# Patient Record
Sex: Male | Born: 1980 | Hispanic: No | Marital: Married | Smoking: Never smoker
Health system: Southern US, Community
[De-identification: ages and names within clinical notes are randomized; demographics above are authoritative.]

## PROBLEM LIST (undated history)

## (undated) DIAGNOSIS — I10 Essential (primary) hypertension: Secondary | ICD-10-CM

---

## 2013-06-24 ENCOUNTER — Encounter (HOSPITAL_COMMUNITY): Payer: Self-pay | Admitting: Emergency Medicine

## 2013-06-24 ENCOUNTER — Emergency Department (INDEPENDENT_AMBULATORY_CARE_PROVIDER_SITE_OTHER)
Admission: EM | Admit: 2013-06-24 | Discharge: 2013-06-24 | Disposition: A | Discharge status: Home / Self Care | Payer: Self-pay | Source: Home / Self Care | Attending: Family Medicine | Admitting: Family Medicine

## 2013-06-24 DIAGNOSIS — K047 Periapical abscess without sinus: Secondary | ICD-10-CM

## 2013-06-24 NOTE — ED Notes (Signed)
Right upper tooth pain, draining gums, and facial swelling.

## 2013-06-24 NOTE — Discharge Instructions (Signed)
see your dentist directly

## 2013-06-24 NOTE — ED Provider Notes (Signed)
CSN: [id number]     Arrival date & time 06/24/13  1125 History   First MD Initiated Contact with Patient 06/24/13 1127     Chief Complaint  Patient presents with  . Dental Pain  . Facial Swelling     (Consider location/radiation/quality/duration/timing/severity/associated sxs/prior Treatment) Patient is a 33 y.o. male presenting with tooth pain. The history is provided by the patient and a friend.  Dental Pain Location:  Upper Upper teeth location:  7/RU lateral incisor Quality:  Throbbing and constant Severity:  Moderate Onset quality:  Gradual Duration:  3 days Progression:  Worsening Chronicity:  New Context: abscess and poor dentition   Associated symptoms: facial pain, facial swelling and gum swelling   Risk factors: lack of dental care and periodontal disease     History reviewed. No pertinent past medical history. History reviewed. No pertinent past surgical history. No family history on file. History  Substance Use Topics  . Smoking status: Never Smoker   . Smokeless tobacco: Not on file  . Alcohol Use: Yes    Review of Systems  Constitutional: Negative.   HENT: Positive for dental problem and facial swelling.       Allergies  Review of patient's allergies indicates no known allergies.  Home Medications  No current outpatient prescriptions on file. There were no vitals taken for this visit. Physical Exam  Nursing note and vitals reviewed. Constitutional: He appears distressed.  HENT:  Mouth/Throat: Abnormal dentition. Dental abscesses and dental caries present.      ED Course  Procedures (including critical care time) Labs Review Labs Reviewed  CULTURE, ROUTINE-ABSCESS   Imaging Review No results found.    MDM   Final diagnoses:  Dental abscess        Linna HoffJames D Kindl, MD 06/24/13 [phone]

## 2013-06-28 LAB — CULTURE, ROUTINE-ABSCESS: CULTURE: NORMAL

## 2015-06-05 ENCOUNTER — Emergency Department (INDEPENDENT_AMBULATORY_CARE_PROVIDER_SITE_OTHER): Payer: Self-pay

## 2015-06-05 ENCOUNTER — Emergency Department (INDEPENDENT_AMBULATORY_CARE_PROVIDER_SITE_OTHER)
Admission: EM | Admit: 2015-06-05 | Discharge: 2015-06-05 | Disposition: A | Discharge status: Home / Self Care | Payer: Self-pay | Source: Home / Self Care | Attending: Emergency Medicine | Admitting: Emergency Medicine

## 2015-06-05 ENCOUNTER — Encounter (HOSPITAL_COMMUNITY): Payer: Self-pay

## 2015-06-05 DIAGNOSIS — J40 Bronchitis, not specified as acute or chronic: Secondary | ICD-10-CM

## 2015-06-05 MED ORDER — GUAIFENESIN-CODEINE 100-10 MG/5ML PO SYRP: 5.0000 mL | ORAL_SOLUTION | Freq: Four times a day (QID) | ORAL | Status: AC | PRN

## 2015-06-05 MED ORDER — IPRATROPIUM-ALBUTEROL 0.5-2.5 (3) MG/3ML IN SOLN
3.0000 mL | Freq: Once | RESPIRATORY_TRACT | Status: AC
  Administered 2015-06-05: 3 mL via RESPIRATORY_TRACT

## 2015-06-05 MED ORDER — PREDNISONE 50 MG PO TABS: 50.0000 mg | ORAL_TABLET | Freq: Every day | ORAL | Status: DC

## 2015-06-05 MED ORDER — ALBUTEROL SULFATE HFA 108 (90 BASE) MCG/ACT IN AERS: 1.0000 | INHALATION_SPRAY | Freq: Four times a day (QID) | RESPIRATORY_TRACT | Status: DC | PRN

## 2015-06-05 MED ORDER — AEROCHAMBER PLUS MISC: Status: AC

## 2015-06-05 MED ORDER — IPRATROPIUM-ALBUTEROL 0.5-2.5 (3) MG/3ML IN SOLN
RESPIRATORY_TRACT | Status: AC
  Filled 2015-06-05: qty 3

## 2015-06-05 NOTE — ED Provider Notes (Signed)
HPI  SUBJECTIVE:  John Aguirre is a 35 y.o. male who presents with dry cough, bodyaches, fatigue for 3 days. He reports intermittent, seconds lung headaches with cough. He denies any other headaches. Symptoms are better with rest, worse with coughing. He has been taking over-the-counter cough syrup without improvement. He denies nausea, vomiting, fevers, chest pain, shortness of breath, wheezing. No ear pain, sore throat, no rhinorrhea, nasal congestion, sinus pain/pressure, postnasal drip. No abdominal pain. No rash. States that he is unable to sleep at night secondary to coughing. He reports sick contacts with URI-like symptoms. No antipyretic in the past 4-6 hours. Past medical history negative for asthma, emphysema, COPD, smoking, diabetes, hypertension. PMD: vnone.    History reviewed. No pertinent past medical history.  History reviewed. No pertinent past surgical history.  No family history on file.  Social History  Substance Use Topics  . Smoking status: Never Smoker   . Smokeless tobacco: None  . Alcohol Use: Yes    No current facility-administered medications for this encounter.  Current outpatient prescriptions:  .  albuterol (PROVENTIL HFA;VENTOLIN HFA) 108 (90 Base) MCG/ACT inhaler, Inhale 1-2 puffs into the lungs every 6 (six) hours as needed for wheezing or shortness of breath., Disp: 1 Inhaler, Rfl: 0 .  guaiFENesin-codeine (CHERATUSSIN AC) 100-10 MG/5ML syrup, Take 5 mLs by mouth 4 (four) times daily as needed for cough or congestion., Disp: 120 mL, Rfl: 0 .  predniSONE (DELTASONE) 50 MG tablet, Take 1 tablet (50 mg total) by mouth daily with breakfast., Disp: 5 tablet, Rfl: 0 .  Spacer/Aero-Holding Chambers (AEROCHAMBER PLUS) inhaler, Use as instructed, Disp: 1 each, Rfl: 2  No Known Allergies   ROS  As noted in HPI.   Physical Exam  BP 135/88 mmHg  Pulse 91  Temp(Src) 97.6 F (36.4 C) (Oral)  Resp 20  SpO2 100%  Constitutional: Well developed, well  nourished, no acute distress Eyes:  EOMI, conjunctiva normal bilaterally HENT: Normocephalic, atraumatic,mucus membranes moist TMs normal bilaterally. No nasal congestion. No sinus tenderness. Normal oropharynx. No postnasal drip. Lymph: No cervical lymphadenopathy Respiratory: Normal inspiratory effort. + rhonchi and wheezing left lower lobe. Fair air movement. Cardiovascular: Normal rate regular rhythm, no murmurs, rubs, gallops GI: nondistended skin: No rash, skin intact Musculoskeletal: no deformities Neurologic: Alert & oriented x 3, no focal neuro deficits Psychiatric: Speech and behavior appropriate   ED Course   Medications  ipratropium-albuterol (DUONEB) 0.5-2.5 (3) MG/3ML nebulizer solution 3 mL (3 mLs Nebulization Given 06/05/15 1755)    Orders Placed This Encounter  Procedures  . DG Chest 2 View    Standing Status: Standing     Number of Occurrences: 1     Standing Expiration Date:     Order Specific Question:  Reason for Exam (SYMPTOM  OR DIAGNOSIS REQUIRED)    Answer:  ronchi LLL r/o PNA    No results found for this or any previous visit (from the past 24 hour(s)). Dg Chest 2 View  06/05/2015  CLINICAL DATA:  Patient with cough, nausea for 3 days. EXAM: CHEST  2 VIEW COMPARISON:  None. FINDINGS: Normal cardiac and mediastinal contours. No large area of pulmonary consolidation. No pleural effusion or pneumothorax. Regional skeleton is unremarkable. IMPRESSION: No active cardiopulmonary disease. Electronically Signed   By: Annia Belt M.D.   On: 06/05/2015 18:00    ED Clinical Impression  Bronchitis   ED Assessment/Plan  Reviewed imaging independently. No pneumonia. See radiology report for full details. Reevaluation after DuoNeb,, lungs clear.  Improved air movement. Patient states that he feels better. Presentation consistent with bronchitis. Home with albuterol with spacer, cough syrup, steroids. Will give primary care referral.  Discussed  imaging, MDM, plan  and followup with patient . Discussed sn/sx that should prompt return to the UC or ED. Patient  agrees with plan.   *This clinic note was created using Dragon dictation software. Therefore, there may be occasional mistakes despite careful proofreading.  ?   Domenick Gong, MD 06/05/15 2155

## 2015-06-05 NOTE — Discharge Instructions (Signed)
Go to www.goodrx.com to look up your medications. This will give you a list of where you can find your prescriptions at the most affordable prices.   This practice is taking new patients. They will see you even if you do not have insurance.  [facility] [address] [place], [place]  [address] [phone]  If you have no primary doctor, here are some resources that may be helpful:  Medicaid-accepting [facility] Providers: - [facility]- [address]  [phone];   - [facility]- [address] [place], [address] [phone]  - [facility]- [address] [phone]  [facility]- [address] [phone]  - Renaye Rakers- [address] [phone]. Only accepts [place] patients after they have her name applied to their card  -Dr. Greggory Stallion Osei-Bonsu, [facility]. [address]    [place], [place] [address]  [phone]  Self Pay (no insurance) in [place]: - Sickle Cell Patients: Dr Willey Blade, [facility] [address] [place] [phone]  - Health [phone]  - Physician Referral Service- [phone]  - [facility]- [address], [place], [phone];  Monday to Friday, 9 a.m. - 7 p.m.; Saturday 9 a.m. to 1 p.m.  [facility]- [address] [place], [place] [phone]  - [facility]- [address]      [phone] - [facility]- [address] [phone]  [facility], [address], [place]; [phone]; or [address] [place]; [phone].   [facility] [place], [place]. [address], [place]; [phone]; Monday to Wednesday, 8:30 a.m. - 5 p.m.; Thursday, 8:30 a.m. - 8 p.m.  [facility], [address], [employer];  [phone]; Monday to Friday, 8 a.m. - 4:30 p.m.   [facility], [place] [address] [place], [phone]; first and third Saturday of the month, 9:30 a.m. - 12:30 p.m.  [facility], [address], [place], [phone]; second Saturday of the month, 9 a.m. -noon.  [facility] for children. For information, call [phone]; [phone]; or [phone].  Other agencies that provide inexpensive medical care:     [facility]  [phone]    [facility]  [phone]    [facility]  [phone] [address] [place] [address]    [facility]  [phone]    [facility]  [phone], [phone]; or [phone].  Chronic Pain Problems Contact [facility]  [phone] Patients need to be referred by their primary care doctor.  [facility]  [facility]     [employer]                          [facility] Dept. [address] [place]                       [address]      [address] [place] [address]   [phone] (After Hours)  General Information: Finding a doctor when you do not have health insurance can be tricky. Although you are not limited by an insurance plan, you  are of course limited by her finances and how much but he can pay out of pocket. ° °What are your options if you don't have health insurance? °  °1) Find a Doctor and Pay Out of Pocket °Although you won't have to find out who is covered by your insurance plan, it is a good idea to ask around and get recommendations. You will then need to call the office and see if the doctor you have chosen will accept you as a new patient and what types of options they offer for patients who are self-pay. Some doctors offer discounts or will set up payment plans for their patients who do not have insurance, but you will need to ask so you aren't surprised when you get to your appointment. ° °2) Contact Your  Local Health Department °Not all health departments have doctors that can see patients for sick visits, but many do, so it is worth a call to see if yours does. If you don't know where your local health department is, you can check in your phone book. The CDC also has a tool to help you locate your state's health department, and many state websites also have listings of all of their local health departments. ° °3) Find a Walk-in Clinic °If your illness is not likely to be very severe or complicated, you may want to try a walk in clinic. These are popping up all over the country in pharmacies, drugstores, and shopping centers. They're usually staffed by nurse practitioners or physician assistants that have been trained to treat common illnesses and complaints. They're usually fairly quick and inexpensive. However, if you have serious medical issues or chronic medical problems, these are probably not your best option ° °

## 2015-06-05 NOTE — ED Notes (Signed)
Patient complains of cough headache and some generalized aches that Started three days ago Patient has been using OTC cough syrup with little relief

## 2016-10-10 IMAGING — DX DG CHEST 2V
2 series · 2 of 2 positions shown · non-contrast
Comparison: None.

CLINICAL DATA: Patient with cough, nausea for 3 days.

EXAM:
CHEST  2 VIEW

[chest pa]
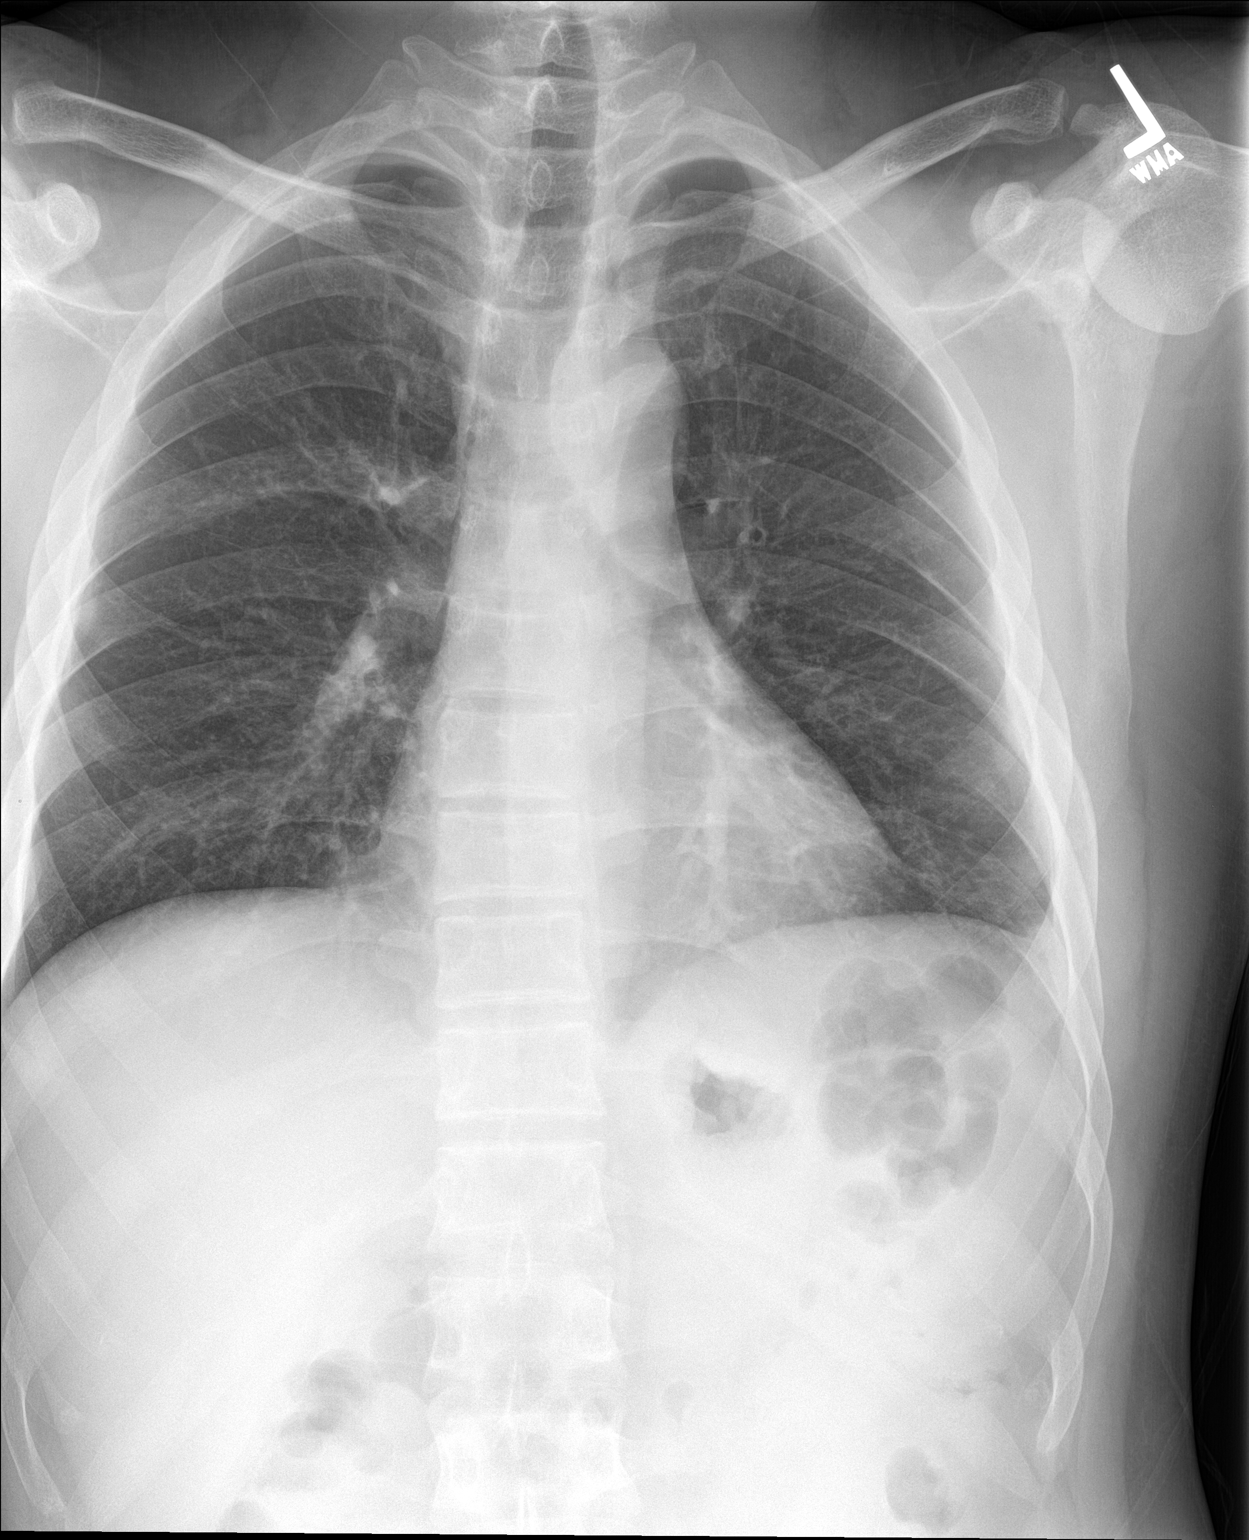

[chest lat]
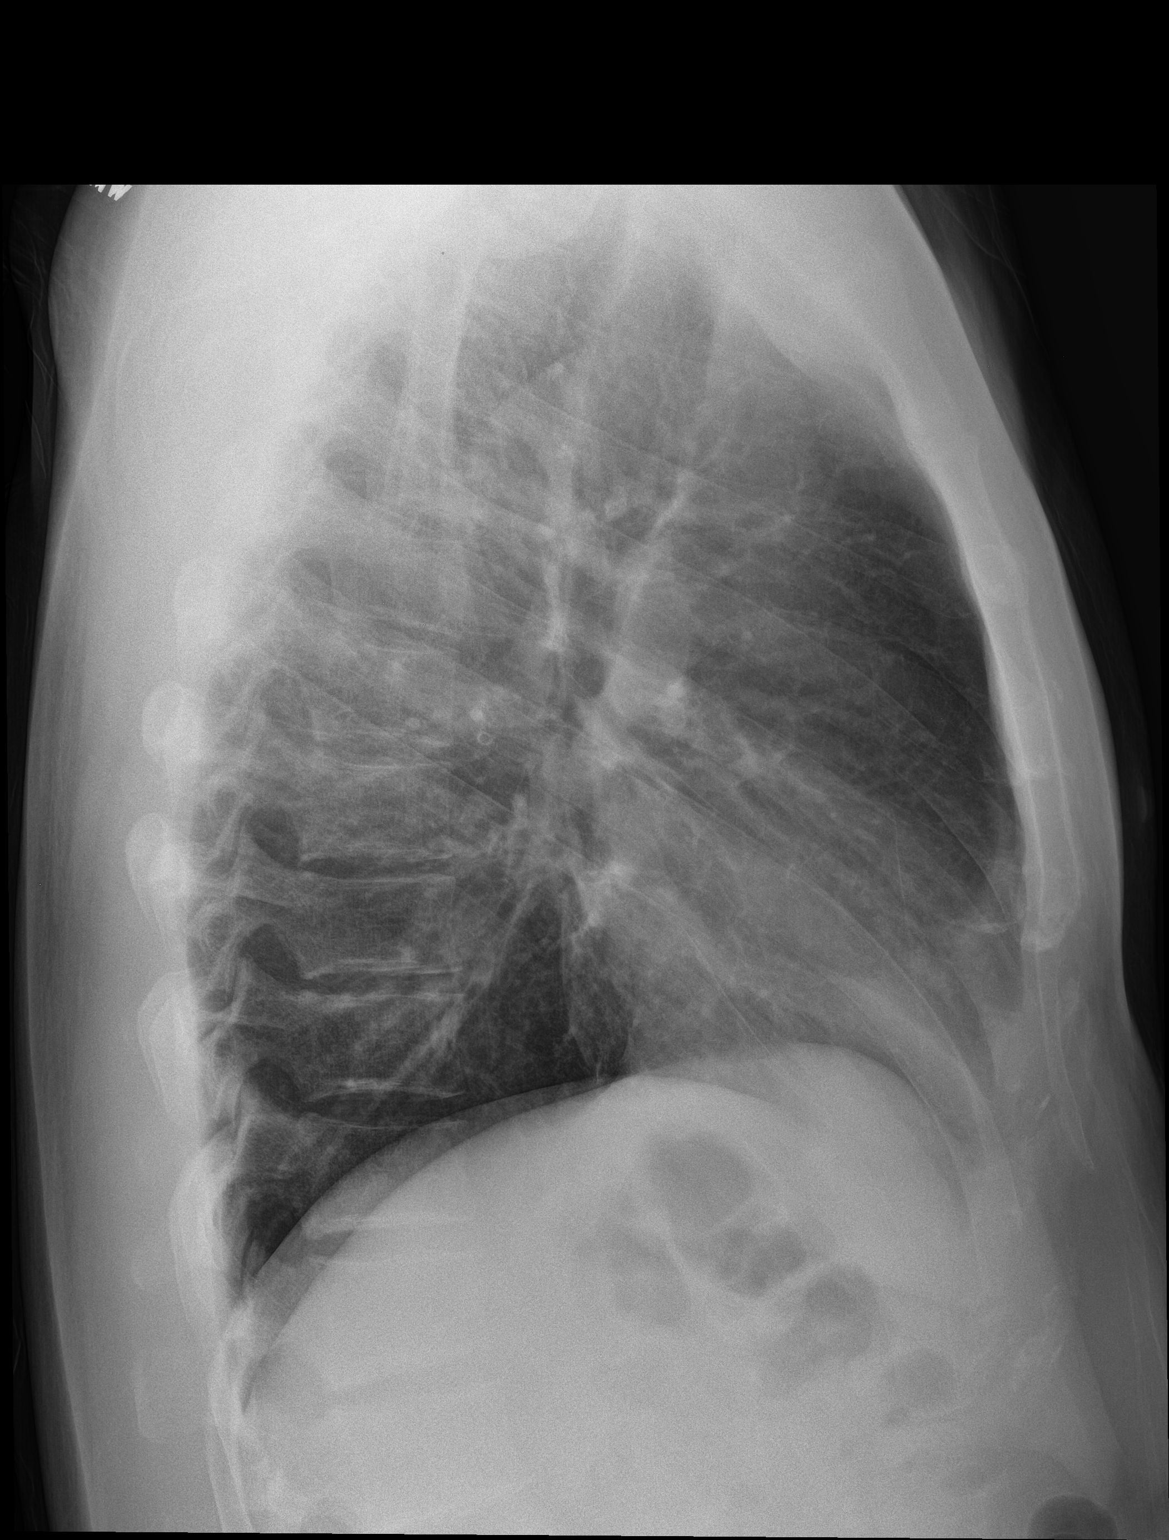

[2 of 2 positions shown; findings below may reference images not displayed]

FINDINGS: Normal cardiac and mediastinal contours. No large area of pulmonary
consolidation. No pleural effusion or pneumothorax. Regional
skeleton is unremarkable.
IMPRESSION: No active cardiopulmonary disease.

## 2017-07-17 ENCOUNTER — Encounter (HOSPITAL_COMMUNITY): Payer: Self-pay | Admitting: Emergency Medicine

## 2017-07-17 ENCOUNTER — Ambulatory Visit (HOSPITAL_COMMUNITY)
Admission: EM | Admit: 2017-07-17 | Discharge: 2017-07-17 | Disposition: A | Discharge status: Home / Self Care | Payer: Managed Care, Other (non HMO) | Attending: Family Medicine | Admitting: Family Medicine

## 2017-07-17 DIAGNOSIS — L03211 Cellulitis of face: Secondary | ICD-10-CM

## 2017-07-17 MED ORDER — SULFAMETHOXAZOLE-TRIMETHOPRIM 800-160 MG PO TABS: 1.0000 | ORAL_TABLET | Freq: Two times a day (BID) | ORAL | 0 refills | Status: AC

## 2017-07-17 NOTE — ED Triage Notes (Signed)
PT scratched the inside of his nose with his finger Saturday. PT's face is now red and swollen.

## 2017-07-19 ENCOUNTER — Ambulatory Visit (HOSPITAL_COMMUNITY)
Admission: EM | Admit: 2017-07-19 | Discharge: 2017-07-19 | Disposition: A | Discharge status: Home / Self Care | Payer: Managed Care, Other (non HMO) | Attending: Internal Medicine | Admitting: Internal Medicine

## 2017-07-19 ENCOUNTER — Encounter (HOSPITAL_COMMUNITY): Payer: Self-pay | Admitting: Family Medicine

## 2017-07-19 DIAGNOSIS — L0201 Cutaneous abscess of face: Secondary | ICD-10-CM

## 2017-07-19 DIAGNOSIS — J340 Abscess, furuncle and carbuncle of nose: Secondary | ICD-10-CM

## 2017-07-19 MED ORDER — CEFTRIAXONE SODIUM 1 G IJ SOLR
1.0000 g | Freq: Once | INTRAMUSCULAR | Status: AC
  Administered 2017-07-19: 1 g via INTRAMUSCULAR

## 2017-07-19 MED ORDER — LIDOCAINE HCL (PF) 1 % IJ SOLN
INTRAMUSCULAR | Status: AC
  Filled 2017-07-19: qty 2

## 2017-07-19 MED ORDER — CEFTRIAXONE SODIUM 1 G IJ SOLR
INTRAMUSCULAR | Status: AC
  Filled 2017-07-19: qty 10

## 2017-07-19 MED ORDER — AMOXICILLIN-POT CLAVULANATE 875-125 MG PO TABS: 1.0000 | ORAL_TABLET | Freq: Two times a day (BID) | ORAL | 0 refills | Status: AC

## 2017-07-19 MED ORDER — HYDROCODONE-ACETAMINOPHEN 5-325 MG PO TABS: 1.0000 | ORAL_TABLET | Freq: Four times a day (QID) | ORAL | 0 refills | Status: DC | PRN

## 2017-07-19 NOTE — Discharge Instructions (Addendum)
Please [facility]up [facility], [address], [place] at 1:10 pm tomorrow Friday 3/15 for management of boil in nose.  Prescriptions for additional antibiotic (amoxicillin/clavulanate) and for a small number of vicodin (for pain) were sent to the pharmacy.  Continue trimethoprim/sulfa prescription from previous urgent care visit.

## 2017-07-19 NOTE — ED Triage Notes (Signed)
Pt here for continued swelling to left nare and face. He was seen here last week and taking abx and not better. The wound started from a scratch scratching nose. The infection is unchanged.

## 2017-07-19 NOTE — ED Provider Notes (Signed)
[facility]    CSN: [id number] Arrival date & time: 07/19/17  1358     History   Chief Complaint Chief Complaint  Patient presents with  . Wound Infection    HPI John Aguirre is a 37 y.o. male.   He presents today for recheck of 1.5 inch boil in the floor of the left nose, was seen on 3/12 and started on Bactrim for this.  This lesion is draining a small amount of purulent material from 2 sites on the floor of the left nare, but has not changed in size.  No fever, no malaise, just not improving.  Tender to touch.    HPI  History reviewed. No pertinent past medical history.  History reviewed. No pertinent surgical history.     Home Medications    Prior to Admission medications   Medication Sig Start Date End Date Taking? Authorizing Provider  albuterol (PROVENTIL HFA;VENTOLIN HFA) 108 (90 Base) MCG/ACT inhaler Inhale 1-2 puffs into the lungs every 6 (six) hours as needed for wheezing or shortness of breath. 06/05/15   Domenick Gong, MD  amoxicillin-clavulanate (AUGMENTIN) 875-125 MG tablet Take 1 tablet by mouth 2 (two) times daily for 10 days. 07/19/17 07/29/17  Isa Rankin, MD  guaiFENesin-codeine (CHERATUSSIN AC) 100-10 MG/5ML syrup Take 5 mLs by mouth 4 (four) times daily as needed for cough or congestion. 06/05/15   Domenick Gong, MD  HYDROcodone-acetaminophen (NORCO/VICODIN) 5-325 MG tablet Take 1 tablet by mouth 4 (four) times daily as needed. 07/19/17   Isa Rankin, MD  predniSONE (DELTASONE) 50 MG tablet Take 1 tablet (50 mg total) by mouth daily with breakfast. 06/05/15   Domenick Gong, MD  Spacer/Aero-Holding Chambers (AEROCHAMBER PLUS) inhaler Use as instructed 06/05/15   Domenick Gong, MD  sulfamethoxazole-trimethoprim (BACTRIM DS,SEPTRA DS) 800-160 MG tablet Take 1 tablet by mouth 2 (two) times daily for 10 days. 07/17/17 07/27/17  Mardella Layman, MD    Family History History reviewed. No pertinent family history.  Social  History Social History   Tobacco Use  . Smoking status: Never Smoker  Substance Use Topics  . Alcohol use: Yes  . Drug use: Yes     Allergies   Patient has no known allergies.   Review of Systems Review of Systems  All other systems reviewed and are negative.    Physical Exam Triage Vital Signs ED Triage Vitals [07/19/17 1507]  Enc Vitals Group     BP 132/85     Pulse Rate 88     Resp 18     Temp 98.7 F (37.1 C)     Temp src      SpO2 98 %     Weight      Height      Pain Score 7     Pain Loc    Updated Vital Signs BP 132/85   Pulse 88   Temp 98.7 F (37.1 C)   Resp 18   SpO2 98%  Physical Exam  Constitutional: He is oriented to person, place, and time. No distress.  Alert, nicely groomed  HENT:  Head: Atraumatic.  The floor of the left nare is distorted by a 1.5 inch firm slightly soft fluctuant swelling, with surrounding erythema and swelling.  Purulent drainage from 2 small areas in the floor of the left nare.  Some erythema extending onto the left cheek.  Upper lip is swollen.  Dentition is poor with multiple broken/carious teeth.  Eyes:  Conjugate gaze, no eye redness/drainage  Neck: Neck supple.  Cardiovascular: Normal rate.  Pulmonary/Chest: No respiratory distress.  Abdominal: He exhibits no distension.  Musculoskeletal: Normal range of motion.  Neurological: He is alert and oriented to person, place, and time.  Skin: Skin is warm and dry.  No cyanosis  Nursing note and vitals reviewed.    UC Treatments / Results   Procedures Procedures (including critical care time) None today  Medications Ordered in UC Medications  cefTRIAXone (ROCEPHIN) injection 1 g (1 g Intramuscular Given 07/19/17 1559)    Final Clinical Impressions(s) / UC Diagnoses   Final diagnoses:  Facial abscess   [facility], [address], [place] at 1:10 pm tomorrow Friday 3/15 for management of boil in nose.  Prescriptions for  additional antibiotic (amoxicillin/clavulanate) and for a small number of vicodin (for pain) were sent to the pharmacy.  Continue trimethoprim/sulfa prescription from previous urgent care visit.  ED Discharge Orders        Ordered    amoxicillin-clavulanate (AUGMENTIN) 875-125 MG tablet  2 times daily     07/19/17 1558    HYDROcodone-acetaminophen (NORCO/VICODIN) 5-325 MG tablet  4 times daily PRN     07/19/17 1558         Isa RankinMurray, Cerise Lieber Wilson, MD 07/21/17 1024

## 2017-07-26 NOTE — ED Provider Notes (Signed)
  [facility]   [id number] 07/17/17 Arrival Time: 1827  ASSESSMENT & PLAN:  1. Facial cellulitis     Meds ordered this encounter  Medications  . sulfamethoxazole-trimethoprim (BACTRIM DS,SEPTRA DS) 800-160 MG tablet    Sig: Take 1 tablet by mouth 2 (two) times daily for 10 days.    Dispense:  20 tablet    Refill:  0    Will f/u in 24-48 hours if not seeing improvement.  Will follow up with PCP or here if worsening or failing to improve as anticipated. Reviewed expectations re: course of current medical issues. Questions answered. Outlined signs and symptoms indicating need for more acute intervention. Patient verbalized understanding. After Visit Summary given.   SUBJECTIVE:  John Aguirre is a 37 y.o. male who presents with a skin complaint.   Location: L nare Onset: abrupt Duration: 2 days Pruritic? No Painful? Yes Progression: increasing steadily  Drainage? No  Known trigger? Questions scratching inside of nose.  Other associated symptoms: none Therapies tried thus far: none Denies fever. No specific aggravating or alleviating factors reported.  ROS: As per HPI.  OBJECTIVE: Vitals:   07/17/17 1940 07/17/17 1942  BP:  (!) 146/80  Pulse:  97  Resp:  16  Temp:  99.2 F (37.3 C)  TempSrc:  Oral  SpO2:  98%  Weight: 200 lb (90.7 kg)     General appearance: alert; no distress Nose: inner medial L nare with slight swelling and erythema; no fluctuance; tender Lungs: clear to auscultation bilaterally Heart: regular rate and rhythm Extremities: no edema Skin: warm and dry Psychological: alert and cooperative; normal mood and affect  No Known Allergies   Social History   Socioeconomic History  . Marital status: Married    Spouse name: Not on file  . Number of children: Not on file  . Years of education: Not on file  . Highest education level: Not on file  Occupational History  . Not on file  Social Needs  . Financial resource strain:  Not on file  . Food insecurity:    Worry: Not on file    Inability: Not on file  . Transportation needs:    Medical: Not on file    Non-medical: Not on file  Tobacco Use  . Smoking status: Never Smoker  Substance and Sexual Activity  . Alcohol use: Yes  . Drug use: Yes  . Sexual activity: Not on file  Lifestyle  . Physical activity:    Days per week: Not on file    Minutes per session: Not on file  . Stress: Not on file  Relationships  . Social connections:    Talks on phone: Not on file    Gets together: Not on file    Attends religious service: Not on file    Active member of club or organization: Not on file    Attends meetings of clubs or organizations: Not on file    Relationship status: Not on file  . Intimate partner violence:    Fear of current or ex partner: Not on file    Emotionally abused: Not on file    Physically abused: Not on file    Forced sexual activity: Not on file  Other Topics Concern  . Not on file  Social History Narrative  . Not on file   No family history on file. History reviewed. No pertinent surgical history.   Mardella LaymanHagler, Joyelle Siedlecki, MD 07/26/17 [phone]

## 2018-06-10 ENCOUNTER — Ambulatory Visit (HOSPITAL_COMMUNITY)
Admission: EM | Admit: 2018-06-10 | Discharge: 2018-06-10 | Disposition: A | Discharge status: Home / Self Care | Payer: Managed Care, Other (non HMO) | Attending: Urgent Care | Admitting: Urgent Care

## 2018-06-10 ENCOUNTER — Encounter (HOSPITAL_COMMUNITY): Payer: Self-pay

## 2018-06-10 ENCOUNTER — Ambulatory Visit (INDEPENDENT_AMBULATORY_CARE_PROVIDER_SITE_OTHER): Payer: Managed Care, Other (non HMO)

## 2018-06-10 DIAGNOSIS — R1013 Epigastric pain: Secondary | ICD-10-CM

## 2018-06-10 DIAGNOSIS — R1110 Vomiting, unspecified: Secondary | ICD-10-CM

## 2018-06-10 DIAGNOSIS — J069 Acute upper respiratory infection, unspecified: Secondary | ICD-10-CM

## 2018-06-10 DIAGNOSIS — B9789 Other viral agents as the cause of diseases classified elsewhere: Secondary | ICD-10-CM | POA: Diagnosis not present

## 2018-06-10 MED ORDER — ONDANSETRON 8 MG PO TBDP: 8.0000 mg | ORAL_TABLET | Freq: Three times a day (TID) | ORAL | 0 refills | Status: DC | PRN

## 2018-06-10 MED ORDER — BENZONATATE 100 MG PO CAPS: 100.0000 mg | ORAL_CAPSULE | Freq: Three times a day (TID) | ORAL | 0 refills | Status: DC | PRN

## 2018-06-10 NOTE — ED Triage Notes (Signed)
Pt presents with complaints of cough, emesis, headache and fever x 4 days.

## 2018-06-10 NOTE — ED Provider Notes (Signed)
MRN: [id number] DOB: 06-Jun-1980  Subjective:   John Aguirre is a 38 y.o. male presenting for 4 day history of moderate productive cough that is improving but has caused stomach pain and vomiting. Has tried APAP. Denies taking chronic medications. Denies smoking cigarettes.  Denies history of asthma, respiratory disorders.  No Known Allergies  History reviewed. No pertinent past medical history.   History reviewed. No pertinent surgical history.  Review of Systems  Constitutional: Positive for fever.  HENT: Negative for ear pain, sinus pain and sore throat.   Eyes: Negative for blurred vision and double vision.  Respiratory: Positive for cough.   Cardiovascular: Negative for chest pain.  Gastrointestinal: Positive for abdominal pain and vomiting. Negative for blood in stool, constipation, diarrhea and nausea.  Genitourinary: Negative for dysuria and hematuria.  Musculoskeletal: Positive for myalgias.  Skin: Negative for rash.  Neurological: Positive for headaches.  Psychiatric/Behavioral: Negative for depression.    Objective:   Vitals: BP 113/81   Pulse 94   Temp 98.7 F (37.1 C)   Resp 20   SpO2 100%   Physical Exam Constitutional:      General: He is not in acute distress.    Appearance: Normal appearance. He is well-developed and normal weight. He is not ill-appearing, toxic-appearing or diaphoretic.  HENT:     Head: Normocephalic and atraumatic.     Right Ear: Tympanic membrane, ear canal and external ear normal. There is no impacted cerumen.     Left Ear: Tympanic membrane, ear canal and external ear normal. There is no impacted cerumen.     Nose: Nose normal. No congestion or rhinorrhea.     Mouth/Throat:     Mouth: Mucous membranes are moist.     Pharynx: Oropharynx is clear. No oropharyngeal exudate or posterior oropharyngeal erythema.  Eyes:     General: No scleral icterus.       Right eye: No discharge.        Left eye: No discharge.     Extraocular  Movements: Extraocular movements intact.     Conjunctiva/sclera: Conjunctivae normal.     Pupils: Pupils are equal, round, and reactive to light.  Neck:     Musculoskeletal: Normal range of motion and neck supple. No neck rigidity or muscular tenderness.  Cardiovascular:     Rate and Rhythm: Normal rate and regular rhythm.     Heart sounds: Normal heart sounds. No murmur. No friction rub. No gallop.   Pulmonary:     Effort: Pulmonary effort is normal. No respiratory distress.     Breath sounds: No stridor. Rhonchi (left sided mid-lower lung fields) present. No wheezing or rales.  Abdominal:     General: Bowel sounds are normal. There is no distension.     Palpations: Abdomen is soft.     Tenderness: There is abdominal tenderness (epigastric). There is no right CVA tenderness, left CVA tenderness, guarding or rebound.  Neurological:     General: No focal deficit present.     Mental Status: He is alert and oriented to person, place, and time.  Psychiatric:        Mood and Affect: Mood normal.        Behavior: Behavior normal.        Thought Content: Thought content normal.    Dg Chest 2 View  Result Date: 06/10/2018 CLINICAL DATA:  38 year old male with productive cough, post-tussive emesis. Abnormal left lung auscultation. EXAM: CHEST - 2 VIEW COMPARISON:  Chest radiographs 06/05/2015. FINDINGS: Normal mediastinal  contours. Mildly lower lung volumes today. Visualized tracheal air column is within normal limits. The upper lungs appear stable in clear. No pneumothorax, pulmonary edema or pleural effusion. Mild crowding of lower lung markings with no convincing acute or confluent pulmonary opacity. No acute osseous abnormality identified. Negative visible bowel gas pattern. IMPRESSION: Lower lung volumes, with no convincing acute cardiopulmonary abnormality. Electronically Signed   By: Odessa FlemingH  Hall M.D.   On: 06/10/2018 18:34   Assessment and Plan :   Viral URI with cough  Abdominal pain,  epigastric  Post-tussive emesis  Chest x-ray is negative.  Will manage for viral type illness including influenza versus standard viral URI.  Recommended supportive care, use Tessalon and Zofran for his symptomatic relief. Counseled patient on potential for adverse effects with medications prescribed today, patient verbalized understanding. ER and return-to-clinic precautions discussed, patient verbalized understanding.    Wallis BambergMani, Aashrith Eves, [place] 06/10/18 [id number]

## 2018-06-10 NOTE — Discharge Instructions (Signed)
We will manage this as a viral syndrome. For sore throat or cough try using a honey-based tea. Use 3 teaspoons of honey with juice squeezed from half lemon. Place shaved pieces of ginger into 1/2-1 cup of water and warm over stove top. Then mix the ingredients and repeat every 4 hours as needed. Please take ibuprofen 400mg every 6 hours alternating with OR taken together with Tylenol 500mg every 6 hours. Hydrate very well with at least 2 liters of water. Eat light meals such as soups to replenish electrolytes and soft fruits, veggies. Start an antihistamine like Zyrtec, Allegra or Claritin.  °

## 2019-06-04 ENCOUNTER — Ambulatory Visit: Payer: Managed Care, Other (non HMO) | Attending: Internal Medicine

## 2019-06-04 DIAGNOSIS — Z20822 Contact with and (suspected) exposure to covid-19: Secondary | ICD-10-CM

## 2019-06-05 LAB — NOVEL CORONAVIRUS, NAA: SARS-CoV-2, NAA: NOT DETECTED

## 2019-06-06 ENCOUNTER — Telehealth: Payer: Self-pay | Admitting: *Deleted

## 2019-06-06 NOTE — Telephone Encounter (Signed)
Patient called given negative covid results . 

## 2019-10-16 IMAGING — DX DG CHEST 2V
2 series · 2 of 2 positions shown · non-contrast
Comparison: Chest radiographs 06/05/2015.

CLINICAL DATA: 37-year-old male with productive cough, post-tussive
emesis. Abnormal left lung auscultation.

EXAM:
CHEST - 2 VIEW

[chest pa]
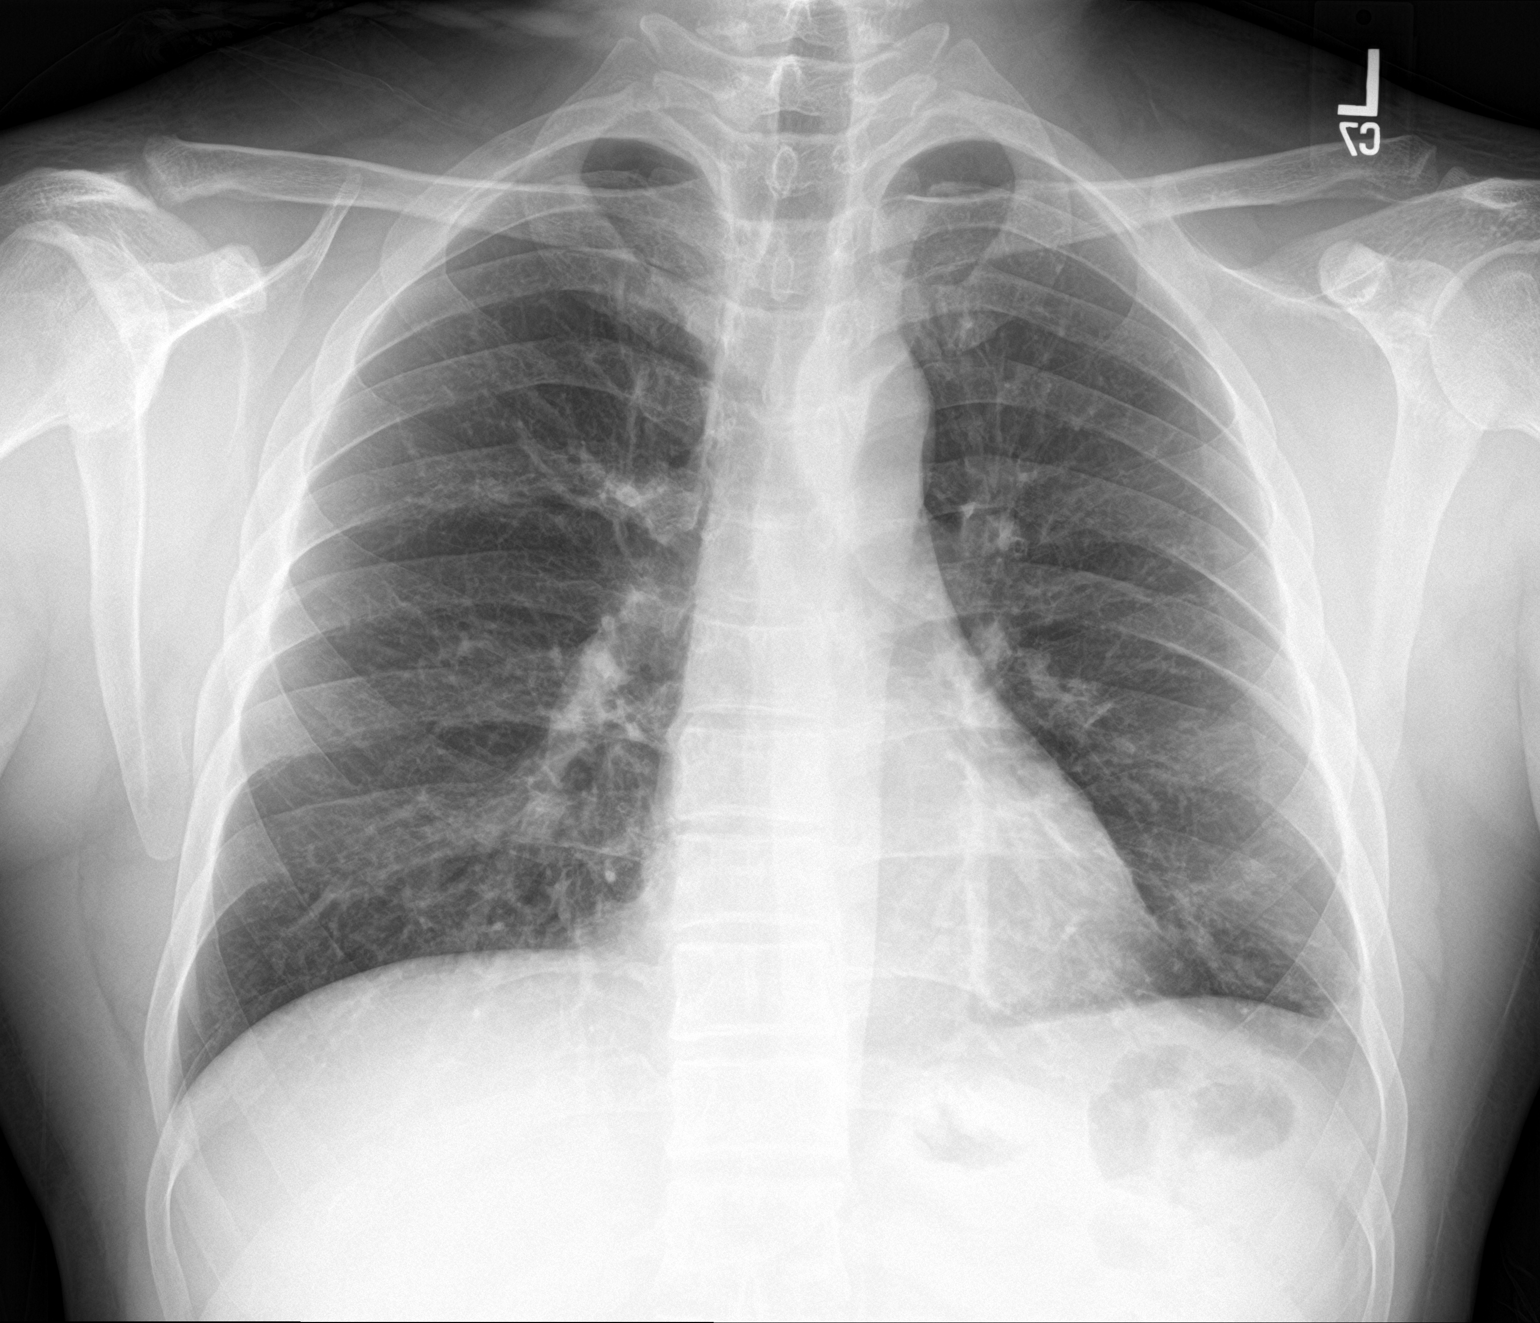

[chest lat]
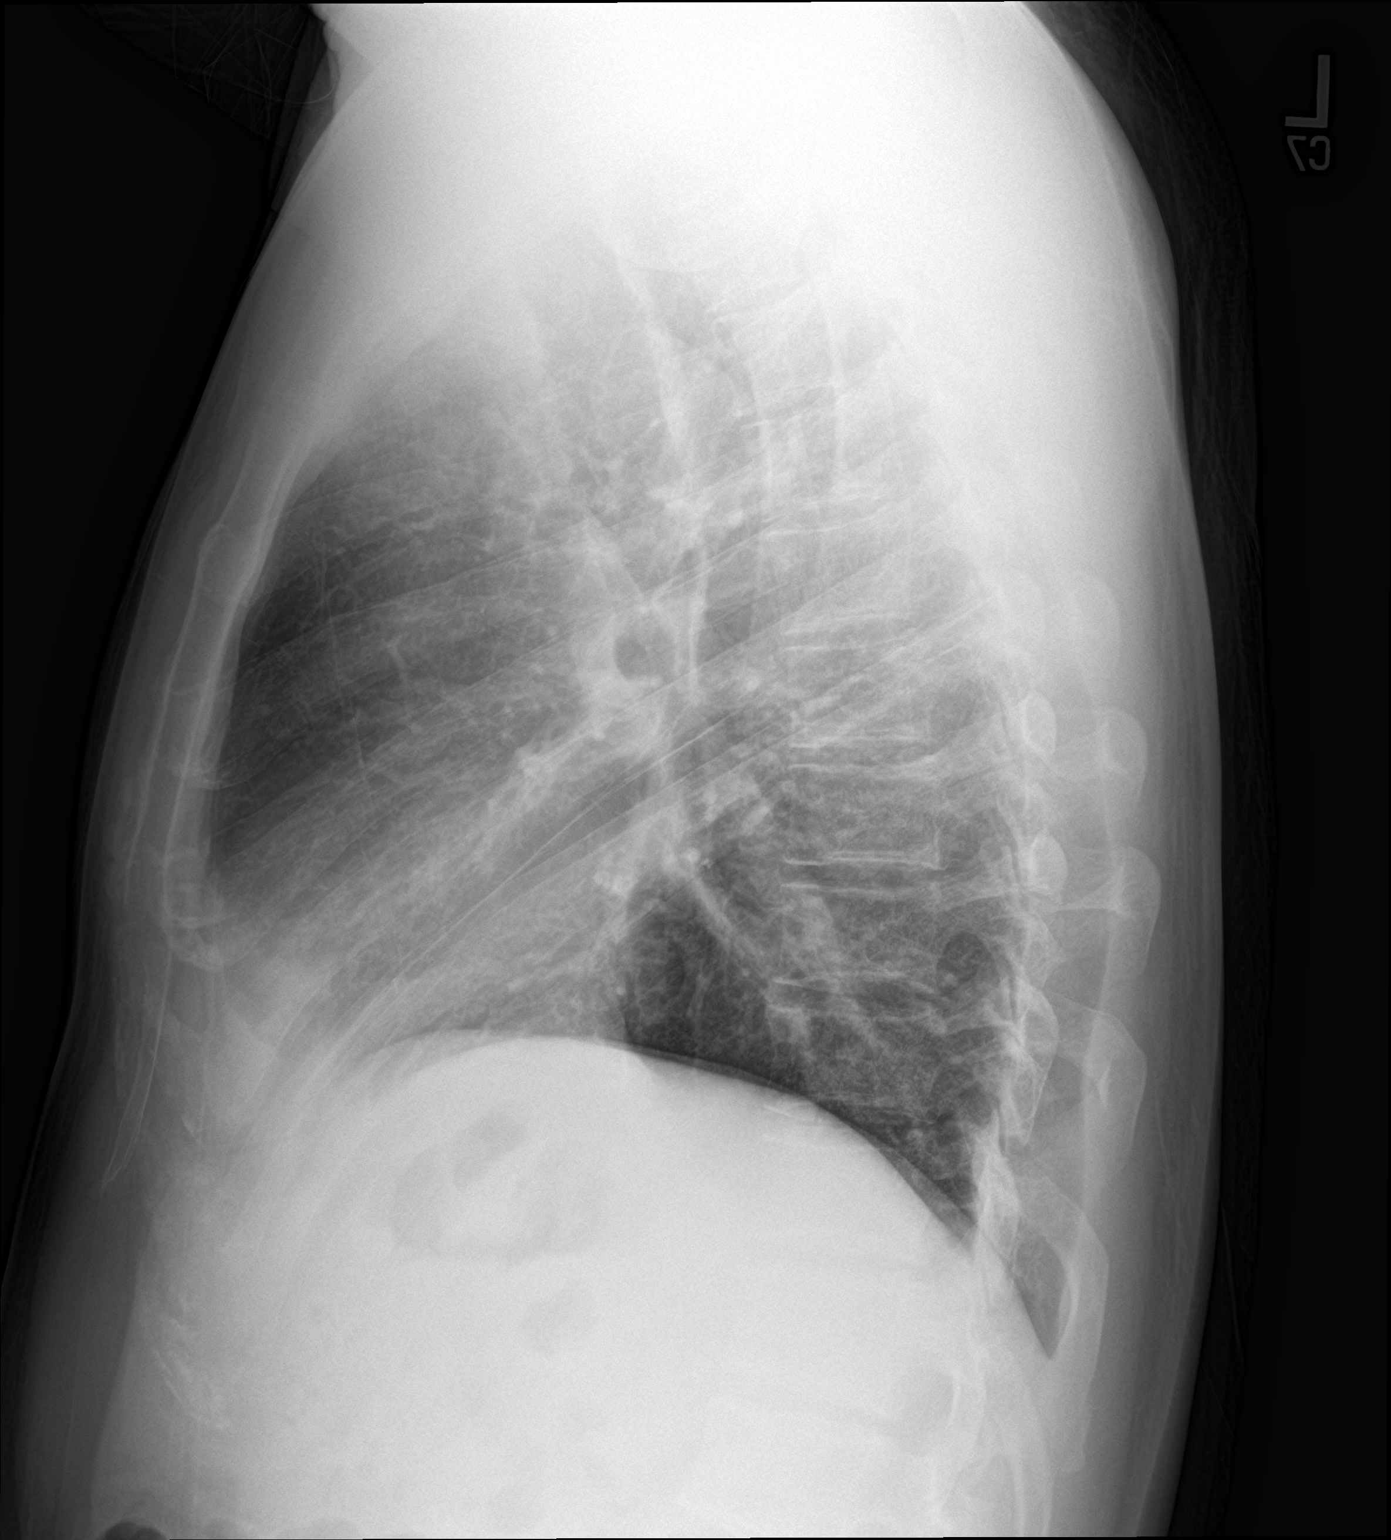

[2 of 2 positions shown; findings below may reference images not displayed]

FINDINGS: Normal mediastinal contours. Mildly lower lung volumes today.
Visualized tracheal air column is within normal limits. The upper
lungs appear stable in clear. No pneumothorax, pulmonary edema or
pleural effusion. Mild crowding of lower lung markings with no
convincing acute or confluent pulmonary opacity. No acute osseous
abnormality identified. Negative visible bowel gas pattern.
IMPRESSION: Lower lung volumes, with no convincing acute cardiopulmonary
abnormality.

## 2020-07-19 ENCOUNTER — Ambulatory Visit (HOSPITAL_COMMUNITY)
Admission: EM | Admit: 2020-07-19 | Discharge: 2020-07-19 | Disposition: A | Discharge status: Home / Self Care | Payer: Managed Care, Other (non HMO)

## 2020-07-19 ENCOUNTER — Encounter (HOSPITAL_COMMUNITY): Payer: Self-pay

## 2020-07-19 ENCOUNTER — Other Ambulatory Visit: Payer: Self-pay

## 2020-07-19 DIAGNOSIS — J3089 Other allergic rhinitis: Secondary | ICD-10-CM | POA: Diagnosis not present

## 2020-07-19 DIAGNOSIS — R059 Cough, unspecified: Secondary | ICD-10-CM

## 2020-07-19 DIAGNOSIS — R062 Wheezing: Secondary | ICD-10-CM

## 2020-07-19 HISTORY — DX: Essential (primary) hypertension: I10

## 2020-07-19 MED ORDER — PREDNISONE 50 MG PO TABS: 50.0000 mg | ORAL_TABLET | Freq: Every day | ORAL | 0 refills | Status: DC

## 2020-07-19 MED ORDER — CETIRIZINE HCL 10 MG PO TABS: 10.0000 mg | ORAL_TABLET | Freq: Every day | ORAL | 2 refills | Status: AC

## 2020-07-19 MED ORDER — ALBUTEROL SULFATE HFA 108 (90 BASE) MCG/ACT IN AERS: 1.0000 | INHALATION_SPRAY | Freq: Four times a day (QID) | RESPIRATORY_TRACT | 0 refills | Status: DC | PRN

## 2020-07-19 MED ORDER — AZELASTINE HCL 0.05 % OP SOLN: 1.0000 [drp] | Freq: Two times a day (BID) | OPHTHALMIC | 12 refills | Status: DC

## 2020-07-19 NOTE — ED Provider Notes (Signed)
[facility]    CSN: [id number] Arrival date & time: 07/19/20  1047      History   Chief Complaint Chief Complaint  Patient presents with  . Cough  . Itchy eyes    HPI John Aguirre is a 40 y.o. male.   Patient presenting today with 60-month history of red, swollen, itchy eyes that drained clear fluid.  This is a chronic issue that flares seasonally.  Has been using lubricating eyedrops with minimal relief.  Denies injury to the eye, blurry vision, headache, fever, temple pain.  Also having a dry cough and wheezing the past 3 days.  History of seasonal allergies and reactive airway.  Does not currently have anything at home for this and has not been trying anything over-the-counter.  No known sick contacts.     Past Medical History:  Diagnosis Date  . Hypertension     There are no problems to display for this patient.   History reviewed. No pertinent surgical history.     Home Medications    Prior to Admission medications   Medication Sig Start Date End Date Taking? Authorizing Provider  azelastine (OPTIVAR) 0.05 % ophthalmic solution Place 1 drop into both eyes 2 (two) times daily. 07/19/20  Yes Particia Nearing, PA-C  cetirizine (ZYRTEC ALLERGY) 10 MG tablet Take 1 tablet (10 mg total) by mouth daily. 07/19/20  Yes Particia Nearing, PA-C  albuterol (VENTOLIN HFA) 108 (90 Base) MCG/ACT inhaler Inhale 1-2 puffs into the lungs every 6 (six) hours as needed for wheezing or shortness of breath. 07/19/20   Particia Nearing, PA-C  benzonatate (TESSALON) 100 MG capsule Take 1-2 capsules (100-200 mg total) by mouth 3 (three) times daily as needed. 06/10/18   Wallis Bamberg, PA-C  guaiFENesin-codeine (CHERATUSSIN AC) 100-10 MG/5ML syrup Take 5 mLs by mouth 4 (four) times daily as needed for cough or congestion. 06/05/15   Domenick Gong, MD  HYDROcodone-acetaminophen (NORCO/VICODIN) 5-325 MG tablet Take 1 tablet by mouth 4 (four) times daily as needed.  07/19/17   Isa Rankin, MD  losartan (COZAAR) 50 MG tablet Take 50 mg by mouth daily. 07/16/20   [provider]  ondansetron (ZOFRAN-ODT) 8 MG disintegrating tablet Take 1 tablet (8 mg total) by mouth every 8 (eight) hours as needed for nausea. 06/10/18   Wallis Bamberg, PA-C  predniSONE (DELTASONE) 50 MG tablet Take 1 tablet (50 mg total) by mouth daily with breakfast. 07/19/20   Particia Nearing, PA-C  Spacer/Aero-Holding Chambers (AEROCHAMBER PLUS) inhaler Use as instructed 06/05/15   Domenick Gong, MD    Family History Family History  Problem Relation Age of Onset  . Asthma Mother   . Healthy Father     Social History Social History   Tobacco Use  . Smoking status: Never Smoker  . Smokeless tobacco: Never Used  Substance Use Topics  . Alcohol use: Yes  . Drug use: Yes     Allergies   Patient has no known allergies.   Review of Systems Review of Systems Per HPI  Physical Exam Triage Vital Signs ED Triage Vitals  Enc Vitals Group     BP 07/19/20 1151 138/84     Pulse Rate 07/19/20 1151 65     Resp 07/19/20 1151 20     Temp 07/19/20 1151 98.5 F (36.9 C)     Temp src --      SpO2 07/19/20 1151 100 %     Weight --  Height --      Head Circumference --      Peak Flow --      Pain Score 07/19/20 1150 0     Pain Loc --      Pain Edu? --      Excl. in GC? --    No data found.  Updated Vital Signs BP 138/84   Pulse 65   Temp 98.5 F (36.9 C)   Resp 20   SpO2 100%   Visual Acuity Right Eye Distance:   Left Eye Distance:   Bilateral Distance:    Right Eye Near:   Left Eye Near:    Bilateral Near:     Physical Exam Vitals and nursing note reviewed.  Constitutional:      Appearance: Normal appearance.  HENT:     Head: Atraumatic.     Right Ear: Tympanic membrane normal.     Left Ear: Tympanic membrane normal.     Nose: Rhinorrhea present.     Mouth/Throat:     Mouth: Mucous membranes are moist.     Pharynx: Posterior  oropharyngeal erythema present.  Eyes:     Extraocular Movements: Extraocular movements intact.     Comments: Conjunctiva injected, edematous surrounding bilateral eyes  Cardiovascular:     Rate and Rhythm: Normal rate and regular rhythm.  Pulmonary:     Effort: Pulmonary effort is normal. No respiratory distress.     Breath sounds: Normal breath sounds. No wheezing or rales.  Musculoskeletal:        General: Normal range of motion.     Cervical back: Normal range of motion and neck supple.  Skin:    General: Skin is warm and dry.  Neurological:     General: No focal deficit present.     Mental Status: He is oriented to person, place, and time.  Psychiatric:        Mood and Affect: Mood normal.        Thought Content: Thought content normal.        Judgment: Judgment normal.      UC Treatments / Results  Labs (all labs ordered are listed, but only abnormal results are displayed) Labs Reviewed - No data to display  EKG   Radiology No results found.  Procedures Procedures (including critical care time)  Medications Ordered in UC Medications - No data to display  Initial Impression / Assessment and Plan / UC Course  I have reviewed the triage vital signs and the nursing notes.  Pertinent labs & imaging results that were available during my care of the patient were reviewed by me and considered in my medical decision making (see chart for details).     Suspect seasonal allergy flare and reactive airway causing his current symptoms.  Will treat with prednisone burst, restart good allergy regimen with Zyrtec, Optivar drops, albuterol as needed.  Return for acutely worsening symptoms.  Final Clinical Impressions(s) / UC Diagnoses   Final diagnoses:  Non-seasonal allergic rhinitis due to other allergic trigger  Cough  Wheezing   Discharge Instructions   None    ED Prescriptions    Medication Sig Dispense Auth. Provider   albuterol (VENTOLIN HFA) 108 (90 Base)  MCG/ACT inhaler Inhale 1-2 puffs into the lungs every 6 (six) hours as needed for wheezing or shortness of breath. 1 each Particia Nearing, PA-C   predniSONE (DELTASONE) 50 MG tablet Take 1 tablet (50 mg total) by mouth daily with breakfast. 3 tablet Maurice March,  Salley Hews, PA-C   azelastine (OPTIVAR) 0.05 % ophthalmic solution Place 1 drop into both eyes 2 (two) times daily. 6 mL Particia Nearing, PA-C   cetirizine (ZYRTEC ALLERGY) 10 MG tablet Take 1 tablet (10 mg total) by mouth daily. 30 tablet Particia Nearing, [place]     PDMP not reviewed this encounter.   Particia Nearing, [place] 07/19/20 1503

## 2020-07-19 NOTE — ED Triage Notes (Signed)
Pt in with c/o dry cough that started Friday and eye itchiness that has been going on for 2 months  Pt has been using cough drops with no relief

## 2023-02-08 ENCOUNTER — Encounter (HOSPITAL_COMMUNITY): Payer: Self-pay | Admitting: Emergency Medicine

## 2023-02-08 ENCOUNTER — Ambulatory Visit (HOSPITAL_COMMUNITY)
Admission: EM | Admit: 2023-02-08 | Discharge: 2023-02-08 | Disposition: A | Discharge status: Home / Self Care | Payer: Managed Care, Other (non HMO) | Attending: Internal Medicine | Admitting: Internal Medicine

## 2023-02-08 DIAGNOSIS — B358 Other dermatophytoses: Secondary | ICD-10-CM | POA: Diagnosis not present

## 2023-02-08 MED ORDER — CALAMINE EX LOTN: 1.0000 | TOPICAL_LOTION | CUTANEOUS | 0 refills | Status: AC | PRN

## 2023-02-08 MED ORDER — TERBINAFINE HCL 250 MG PO TABS: 250.0000 mg | ORAL_TABLET | Freq: Every day | ORAL | 0 refills | Status: AC

## 2023-02-08 NOTE — ED Provider Notes (Addendum)
[facility]    CSN: [id number] Arrival date & time: 02/08/23  1611      History   Chief Complaint Chief Complaint  Patient presents with   Rash    HPI John Aguirre is a 42 y.o. male comes to the urgent care with complaints of itchy rash on both sides of the face of 1 week duration.  Patient's symptoms started a week ago and has been persistent.  He has tried cortisone with no improvement in symptoms.  Some of the rash is weeping.  There is no erythema.  He has similar rash on his chest and upper abdomen.  He works outdoors and sweats a lot.  No history of similar episodes in the past.  Patient denies any changes in cosmetics, soaps or body lotion.   HPI  Past Medical History:  Diagnosis Date   Hypertension     There are no problems to display for this patient.   No past surgical history on file.     Home Medications    Prior to Admission medications   Medication Sig Start Date End Date Taking? Authorizing Provider  calamine lotion Apply 1 Application topically as needed for itching. 02/08/23  Yes Azlynn Mitnick, Britta Mccreedy, MD  terbinafine (LAMISIL) 250 MG tablet Take 1 tablet (250 mg total) by mouth daily for 10 days. 02/08/23 02/18/23 Yes Jesstin Studstill, Britta Mccreedy, MD  cetirizine (ZYRTEC ALLERGY) 10 MG tablet Take 1 tablet (10 mg total) by mouth daily. 07/19/20   Particia Nearing, PA-C  guaiFENesin-codeine (CHERATUSSIN AC) 100-10 MG/5ML syrup Take 5 mLs by mouth 4 (four) times daily as needed for cough or congestion. Patient not taking: Reported on 02/08/2023 06/05/15   Domenick Gong, MD  losartan (COZAAR) 50 MG tablet Take 50 mg by mouth daily. 07/16/20   [provider]  Spacer/Aero-Holding Chambers (AEROCHAMBER PLUS) inhaler Use as instructed 06/05/15   Domenick Gong, MD    Family History Family History  Problem Relation Age of Onset   Asthma Mother    Healthy Father     Social History Social History   Tobacco Use   Smoking status: Some Days     Types: Cigarettes   Smokeless tobacco: Never  Vaping Use   Vaping status: Never Used  Substance Use Topics   Alcohol use: Yes    Comment: occasionally   Drug use: Yes     Allergies   Patient has no known allergies.   Review of Systems Review of Systems As per HPI  Physical Exam Triage Vital Signs ED Triage Vitals  Encounter Vitals Group     BP 02/08/23 1645 (!) 156/99     Systolic BP Percentile --      Diastolic BP Percentile --      Pulse Rate 02/08/23 1645 69     Resp 02/08/23 1645 17     Temp 02/08/23 1645 98.4 F (36.9 C)     Temp Source 02/08/23 1645 Oral     SpO2 02/08/23 1645 96 %     Weight --      Height --      Head Circumference --      Peak Flow --      Pain Score 02/08/23 1643 5     Pain Loc --      Pain Education --      Exclude from Growth Chart --    No data found.  Updated Vital Signs BP (!) 156/99 (BP Location: Right Arm) Comment: did not  take bp medication today  Pulse 69   Temp 98.4 F (36.9 C) (Oral)   Resp 17   SpO2 96%   Visual Acuity Right Eye Distance:   Left Eye Distance:   Bilateral Distance:    Right Eye Near:   Left Eye Near:    Bilateral Near:     Physical Exam Vitals and nursing note reviewed.  Skin:    Comments: Weeping rash involving the infraorbital margin and over the zygomatic bone.  The rash is bilateral.  No involvement of the eyes or upper eyelid.      UC Treatments / Results  Labs (all labs ordered are listed, but only abnormal results are displayed) Labs Reviewed - No data to display  EKG   Radiology No results found.  Procedures Procedures (including critical care time)  Medications Ordered in UC Medications - No data to display  Initial Impression / Assessment and Plan / UC Course  I have reviewed the triage vital signs and the nursing notes.  Pertinent labs & imaging results that were available during my care of the patient were reviewed by me and considered in my medical decision  making (see chart for details).     Tinea faciale: Lamisil 250 mg orally daily for 10 days Calamine lotion as needed for itching Return precautions given. Patient advised to use mild soap and cosmetics for sensitive skin. Final Clinical Impressions(s) / UC Diagnoses   Final diagnoses:  Tinea faciale     Discharge Instructions      Please take medications as prescribed Please apply calamine lotion to the affected areas as needed for itching.  Please avoid applying the calamine lotion to your eyelids.  If you have worsening symptoms please return to urgent care to be reevaluated. Please use soap for sensitive skin.   ED Prescriptions     Medication Sig Dispense Auth. Provider   terbinafine (LAMISIL) 250 MG tablet Take 1 tablet (250 mg total) by mouth daily for 10 days. 10 tablet Benjimin Hadden, Britta Mccreedy, MD   calamine lotion Apply 1 Application topically as needed for itching. 177 mL Bryla Burek, Britta Mccreedy, MD      PDMP not reviewed this encounter.   Merrilee Jansky, MD 02/08/23 Mallie Snooks    Merrilee Jansky, MD 02/08/23 [phone]

## 2023-02-08 NOTE — ED Triage Notes (Signed)
Pt c/o rash on both sides of face that is itchy. he first noticed it last week.  He has tried OTC cortisone with no relief

## 2023-02-08 NOTE — Discharge Instructions (Addendum)
Please take medications as prescribed Please apply calamine lotion to the affected areas as needed for itching.  Please avoid applying the calamine lotion to your eyelids.  If you have worsening symptoms please return to urgent care to be reevaluated. Please use soap for sensitive skin.
